# Patient Record
Sex: Male | Born: 1990 | Race: Black or African American | Hispanic: No | Marital: Single | Smoking: Current every day smoker
Health system: Southern US, Community
[De-identification: ages and names within clinical notes are randomized; demographics above are authoritative.]

---

## 2017-07-26 ENCOUNTER — Other Ambulatory Visit: Payer: Self-pay

## 2017-07-26 ENCOUNTER — Emergency Department: Payer: Self-pay

## 2017-07-26 ENCOUNTER — Emergency Department
Admission: EM | Admit: 2017-07-26 | Discharge: 2017-07-26 | Disposition: A | Discharge status: Home / Self Care | Payer: Self-pay | Attending: Emergency Medicine | Admitting: Emergency Medicine

## 2017-07-26 DIAGNOSIS — F17200 Nicotine dependence, unspecified, uncomplicated: Secondary | ICD-10-CM | POA: Insufficient documentation

## 2017-07-26 DIAGNOSIS — M4306 Spondylolysis, lumbar region: Secondary | ICD-10-CM

## 2017-07-26 DIAGNOSIS — M5440 Lumbago with sciatica, unspecified side: Secondary | ICD-10-CM

## 2017-07-26 MED ORDER — CYCLOBENZAPRINE HCL 10 MG PO TABS: 10.0000 mg | ORAL_TABLET | Freq: Three times a day (TID) | ORAL | 0 refills | Status: DC | PRN

## 2017-07-26 MED ORDER — NAPROXEN 500 MG PO TABS: 500.0000 mg | ORAL_TABLET | Freq: Two times a day (BID) | ORAL | Status: DC
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
## 2017-07-26 NOTE — Discharge Instructions (Signed)
Advised to consider chiropractor for further evaluation and treatment.

## 2017-08-15 ENCOUNTER — Other Ambulatory Visit: Payer: Self-pay

## 2017-08-15 ENCOUNTER — Emergency Department: Payer: Self-pay

## 2017-08-15 ENCOUNTER — Emergency Department
Admission: EM | Admit: 2017-08-15 | Discharge: 2017-08-15 | Disposition: A | Discharge status: Home / Self Care | Payer: Self-pay | Attending: Emergency Medicine | Admitting: Emergency Medicine

## 2017-08-15 DIAGNOSIS — Y9389 Activity, other specified: Secondary | ICD-10-CM | POA: Insufficient documentation

## 2017-08-15 DIAGNOSIS — S0081XA Abrasion of other part of head, initial encounter: Secondary | ICD-10-CM | POA: Insufficient documentation

## 2017-08-15 DIAGNOSIS — R51 Headache: Secondary | ICD-10-CM | POA: Insufficient documentation

## 2017-08-15 DIAGNOSIS — R0781 Pleurodynia: Secondary | ICD-10-CM | POA: Insufficient documentation

## 2017-08-15 DIAGNOSIS — H1132 Conjunctival hemorrhage, left eye: Secondary | ICD-10-CM | POA: Insufficient documentation

## 2017-08-15 DIAGNOSIS — M7918 Myalgia, other site: Secondary | ICD-10-CM | POA: Insufficient documentation

## 2017-08-15 DIAGNOSIS — Z79899 Other long term (current) drug therapy: Secondary | ICD-10-CM | POA: Insufficient documentation

## 2017-08-15 DIAGNOSIS — R6884 Jaw pain: Secondary | ICD-10-CM | POA: Insufficient documentation

## 2017-08-15 DIAGNOSIS — Y929 Unspecified place or not applicable: Secondary | ICD-10-CM | POA: Insufficient documentation

## 2017-08-15 DIAGNOSIS — Y999 Unspecified external cause status: Secondary | ICD-10-CM | POA: Insufficient documentation

## 2017-08-15 DIAGNOSIS — F17200 Nicotine dependence, unspecified, uncomplicated: Secondary | ICD-10-CM | POA: Insufficient documentation

## 2017-08-15 MED ORDER — KETOROLAC TROMETHAMINE 30 MG/ML IJ SOLN
30.0000 mg | Freq: Once | INTRAMUSCULAR | Status: AC
  Administered 2017-08-15: 30 mg via INTRAMUSCULAR
  Filled 2017-08-15: qty 1

## 2017-08-15 MED ORDER — TIZANIDINE HCL 4 MG PO TABS: 4.0000 mg | ORAL_TABLET | Freq: Three times a day (TID) | ORAL | 0 refills | Status: DC

## 2017-08-15 MED ORDER — KETOROLAC TROMETHAMINE 10 MG PO TABS: 10.0000 mg | ORAL_TABLET | Freq: Four times a day (QID) | ORAL | 0 refills | Status: DC | PRN
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
## 2017-09-25 ENCOUNTER — Emergency Department
Admission: EM | Admit: 2017-09-25 | Discharge: 2017-09-25 | Disposition: A | Discharge status: Home / Self Care | Payer: Self-pay | Attending: Emergency Medicine | Admitting: Emergency Medicine

## 2017-09-25 ENCOUNTER — Emergency Department: Payer: Self-pay

## 2017-09-25 ENCOUNTER — Encounter: Payer: Self-pay | Admitting: Emergency Medicine

## 2017-09-25 DIAGNOSIS — S9031XA Contusion of right foot, initial encounter: Secondary | ICD-10-CM | POA: Insufficient documentation

## 2017-09-25 DIAGNOSIS — Y929 Unspecified place or not applicable: Secondary | ICD-10-CM | POA: Insufficient documentation

## 2017-09-25 DIAGNOSIS — F17200 Nicotine dependence, unspecified, uncomplicated: Secondary | ICD-10-CM | POA: Insufficient documentation

## 2017-09-25 DIAGNOSIS — Y999 Unspecified external cause status: Secondary | ICD-10-CM | POA: Insufficient documentation

## 2017-09-25 DIAGNOSIS — Z79899 Other long term (current) drug therapy: Secondary | ICD-10-CM | POA: Insufficient documentation

## 2017-09-25 DIAGNOSIS — B880 Other acariasis: Secondary | ICD-10-CM | POA: Insufficient documentation

## 2017-09-25 DIAGNOSIS — Y939 Activity, unspecified: Secondary | ICD-10-CM | POA: Insufficient documentation

## 2017-09-25 DIAGNOSIS — S62514A Nondisplaced fracture of proximal phalanx of right thumb, initial encounter for closed fracture: Secondary | ICD-10-CM | POA: Insufficient documentation

## 2017-09-25 DIAGNOSIS — W228XXA Striking against or struck by other objects, initial encounter: Secondary | ICD-10-CM | POA: Insufficient documentation

## 2017-09-25 MED ORDER — TRIAMCINOLONE ACETONIDE 0.1 % EX CREA: 1.0000 "application " | TOPICAL_CREAM | Freq: Four times a day (QID) | CUTANEOUS | 0 refills | Status: DC

## 2017-09-25 MED ORDER — HYDROCODONE-ACETAMINOPHEN 5-325 MG PO TABS: 1.0000 | ORAL_TABLET | ORAL | 0 refills | Status: DC | PRN

## 2017-09-25 MED ORDER — MELOXICAM 15 MG PO TABS: 15.0000 mg | ORAL_TABLET | Freq: Every day | ORAL | 0 refills | Status: DC
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
## 2017-09-25 NOTE — ED Triage Notes (Signed)
Patient presents to the ED with painful right foot and right thumb since patient dropped a 145lb steel plate on both at separate times yesterday.  Patient also reports rash for several days after dealing with hay.  Patient states I sort of got hurt at work but I don't want to say it's work related or anything.

## 2017-10-18 ENCOUNTER — Emergency Department
Admission: EM | Admit: 2017-10-18 | Discharge: 2017-10-18 | Disposition: A | Discharge status: Home / Self Care | Payer: Self-pay | Attending: Emergency Medicine | Admitting: Emergency Medicine

## 2017-10-18 ENCOUNTER — Encounter: Payer: Self-pay | Admitting: Emergency Medicine

## 2017-10-18 DIAGNOSIS — F17200 Nicotine dependence, unspecified, uncomplicated: Secondary | ICD-10-CM | POA: Insufficient documentation

## 2017-10-18 DIAGNOSIS — X58XXXA Exposure to other specified factors, initial encounter: Secondary | ICD-10-CM | POA: Insufficient documentation

## 2017-10-18 DIAGNOSIS — Y999 Unspecified external cause status: Secondary | ICD-10-CM | POA: Insufficient documentation

## 2017-10-18 DIAGNOSIS — S62501A Fracture of unspecified phalanx of right thumb, initial encounter for closed fracture: Secondary | ICD-10-CM | POA: Insufficient documentation

## 2017-10-18 DIAGNOSIS — Y939 Activity, unspecified: Secondary | ICD-10-CM | POA: Insufficient documentation

## 2017-10-18 DIAGNOSIS — Y929 Unspecified place or not applicable: Secondary | ICD-10-CM | POA: Insufficient documentation

## 2017-10-18 DIAGNOSIS — Z79899 Other long term (current) drug therapy: Secondary | ICD-10-CM | POA: Insufficient documentation

## 2017-10-18 NOTE — ED Notes (Addendum)
See triage note  States he was seen and had splint applied to right thumb  States he wanted the splint to be removed  Has not f/u with ortho MD good pulses

## 2017-10-18 NOTE — Discharge Instructions (Addendum)
Your splint has been replaced but you need to follow orthopedic for definitive evaluation and treatment consisting of casting.

## 2017-10-18 NOTE — ED Provider Notes (Signed)
[facility] Emergency Department Provider Note   ____________________________________________   First MD Initiated Contact with Patient 10/18/17 1036     (approximate)  I have reviewed the triage vital signs and the nursing notes.   HISTORY  Chief Complaint Hand Injury    HPI Miguel Allen is a 27 y.o. male patient presents status post right thumb fracture 3 weeks ago.  Patient was seen at this facility and placed in a thumb spica splint.  Patient advised to follow orthopedic but did not comply.  Patient requesting reevaluation and removal of splint and to have some placed in a cast by this department.  Patient has not removed previous splint.   History reviewed. No pertinent past medical history.  There are no active problems to display for this patient.   History reviewed. No pertinent surgical history.  Prior to Admission medications   Medication Sig Start Date End Date Taking? Authorizing Provider  cyclobenzaprine (FLEXERIL) 10 MG tablet Take 1 tablet (10 mg total) by mouth 3 (three) times daily as needed. 07/26/17   Joni ReiningSmith, Ronald K, PA-C  HYDROcodone-acetaminophen (NORCO/VICODIN) 5-325 MG tablet Take 1 tablet by mouth every 4 (four) hours as needed for moderate pain. 09/25/17   Cuthriell, Delorise RoyalsJonathan D, PA-C  ketorolac (TORADOL) 10 MG tablet Take 1 tablet (10 mg total) by mouth every 6 (six) hours as needed. 08/15/17   Enid DerryWagner, Ashley, PA-C  meloxicam (MOBIC) 15 MG tablet Take 1 tablet (15 mg total) by mouth daily. 09/25/17   Cuthriell, Delorise RoyalsJonathan D, PA-C  naproxen (NAPROSYN) 500 MG tablet Take 1 tablet (500 mg total) by mouth 2 (two) times daily with a meal. 07/26/17   Joni ReiningSmith, Ronald K, PA-C  tiZANidine (ZANAFLEX) 4 MG tablet Take 1 tablet (4 mg total) by mouth 3 (three) times daily. 08/15/17 08/15/18  Enid DerryWagner, Ashley, PA-C  triamcinolone cream (KENALOG) 0.1 % Apply 1 application topically 4 (four) times daily. 09/25/17   Cuthriell, Delorise RoyalsJonathan D, PA-C     Allergies Patient has no known allergies.  No family history on file.  Social History Social History   Tobacco Use  . Smoking status: Current Every Day Smoker  . Smokeless tobacco: Never Used  Substance Use Topics  . Alcohol use: Yes  . Drug use: Yes    Review of Systems Constitutional: No fever/chills Eyes: No visual changes. ENT: No sore throat. Cardiovascular: Denies chest pain. Respiratory: Denies shortness of breath. Gastrointestinal: No abdominal pain.  No nausea, no vomiting.  No diarrhea.  No constipation. Genitourinary: Negative for dysuria. Musculoskeletal: Fractured right thumb. Skin: Negative for rash. Neurological: Negative for headaches, focal weakness or numbness.   ____________________________________________   PHYSICAL EXAM:  VITAL SIGNS: ED Triage Vitals  Enc Vitals Group     BP 10/18/17 1020 129/67     Pulse Rate 10/18/17 1020 70     Resp 10/18/17 1020 20     Temp 10/18/17 1020 98.2 F (36.8 C)     Temp Source 10/18/17 1020 Oral     SpO2 10/18/17 1020 98 %     Weight 10/18/17 1021 200 lb (90.7 kg)     Height 10/18/17 1021 6\' 2"  (1.88 m)     Head Circumference --      Peak Flow --      Pain Score 10/18/17 1021 0     Pain Loc --      Pain Edu? --      Excl. in GC? --    Constitutional: Alert and oriented.  Well appearing and in no acute distress. Cardiovascular: Normal rate, regular rhythm. Grossly normal heart sounds.  Good peripheral circulation. Respiratory: Normal respiratory effort.  No retractions. Lungs CTAB. Skin:  Skin is warm, dry and intact. No rash noted. Psychiatric: Mood and affect are normal. Speech and behavior are normal.  ____________________________________________   LABS (all labs ordered are listed, but only abnormal results are displayed)  Labs Reviewed - No data to display ____________________________________________  EKG   ____________________________________________  RADIOLOGY Reviewed x-ray from  previous visit.  ED MD interpretation:    Official radiology report(s): No results found.  ____________________________________________   PROCEDURES  Procedure(s) performed: None  Procedures  Critical Care performed: No  ____________________________________________   INITIAL IMPRESSION / ASSESSMENT AND PLAN / ED COURSE  As part of my medical decision making, I reviewed the following data within the electronic MEDICAL RECORD NUMBER    Patient presents to ED requesting old splint be removed and a cast placed to complete healing process of fractured right thumb.  Previous splint is in decay and dirty.  Discussed patient rationale for not cast in the ED.  Patient will be resplinted and advised to follow-up with orthopedics for definitive evaluation and treatment.      ____________________________________________   FINAL CLINICAL IMPRESSION(S) / ED DIAGNOSES  Final diagnoses:  Closed nondisplaced fracture of phalanx of right thumb, unspecified phalanx, initial encounter     ED Discharge Orders    None       Note:  This document was prepared using Dragon voice recognition software and may include unintentional dictation errors.    Joni Reining, PA-C 10/18/17 1055    Jene Every, MD 10/18/17 [phone]

## 2017-10-18 NOTE — ED Triage Notes (Signed)
Pt reports three weeks ago he was seen here and put in a cast for a broken thumb. Pt states that he needs his hand checked out to see if he can come out of the cast. Pt admits that he did not follow up with an orthopedic doctor.

## 2017-12-25 ENCOUNTER — Encounter: Payer: Self-pay | Admitting: Emergency Medicine

## 2017-12-25 ENCOUNTER — Other Ambulatory Visit: Payer: Self-pay

## 2017-12-25 ENCOUNTER — Emergency Department: Payer: No Typology Code available for payment source

## 2017-12-25 ENCOUNTER — Emergency Department
Admission: EM | Admit: 2017-12-25 | Discharge: 2017-12-26 | Disposition: A | Discharge status: Home / Self Care | Payer: No Typology Code available for payment source | Attending: Emergency Medicine | Admitting: Emergency Medicine

## 2017-12-25 DIAGNOSIS — S199XXA Unspecified injury of neck, initial encounter: Secondary | ICD-10-CM | POA: Diagnosis present

## 2017-12-25 DIAGNOSIS — Y999 Unspecified external cause status: Secondary | ICD-10-CM | POA: Diagnosis not present

## 2017-12-25 DIAGNOSIS — F17200 Nicotine dependence, unspecified, uncomplicated: Secondary | ICD-10-CM | POA: Insufficient documentation

## 2017-12-25 DIAGNOSIS — S1093XA Contusion of unspecified part of neck, initial encounter: Secondary | ICD-10-CM | POA: Diagnosis not present

## 2017-12-25 DIAGNOSIS — Y92481 Parking lot as the place of occurrence of the external cause: Secondary | ICD-10-CM | POA: Insufficient documentation

## 2017-12-25 DIAGNOSIS — R51 Headache: Secondary | ICD-10-CM | POA: Insufficient documentation

## 2017-12-25 DIAGNOSIS — Y9389 Activity, other specified: Secondary | ICD-10-CM | POA: Insufficient documentation

## 2017-12-25 LAB — CBC
HCT: 36.8 % — ABNORMAL LOW (ref 40.0–52.0)
Hemoglobin: 12.9 g/dL — ABNORMAL LOW (ref 13.0–18.0)
MCH: 29.3 pg (ref 26.0–34.0)
MCHC: 35 g/dL (ref 32.0–36.0)
MCV: 83.7 fL (ref 80.0–100.0)
PLATELETS: 195 10*3/uL (ref 150–440)
RBC: 4.39 MIL/uL — ABNORMAL LOW (ref 4.40–5.90)
RDW: 14.4 % (ref 11.5–14.5)
WBC: 10.7 10*3/uL — ABNORMAL HIGH (ref 3.8–10.6)

## 2017-12-25 MED ORDER — ONDANSETRON HCL 4 MG/2ML IJ SOLN
4.0000 mg | Freq: Once | INTRAMUSCULAR | Status: AC
Start: 2017-12-25 — End: 2017-12-25
  Administered 2017-12-25: 4 mg via INTRAVENOUS
  Filled 2017-12-25: qty 2

## 2017-12-25 MED ORDER — MORPHINE SULFATE (PF) 2 MG/ML IV SOLN
2.0000 mg | Freq: Once | INTRAVENOUS | Status: AC
  Administered 2017-12-25: 2 mg via INTRAVENOUS
  Filled 2017-12-25: qty 1
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
## 2017-12-25 NOTE — ED Notes (Signed)
Pt called x 1 without response

## 2017-12-26 ENCOUNTER — Encounter: Payer: Self-pay | Admitting: Radiology

## 2017-12-26 ENCOUNTER — Emergency Department: Payer: No Typology Code available for payment source

## 2017-12-26 LAB — COMPREHENSIVE METABOLIC PANEL
ALK PHOS: 49 U/L (ref 38–126)
ALT: 12 U/L (ref 0–44)
AST: 22 U/L (ref 15–41)
Albumin: 4.4 g/dL (ref 3.5–5.0)
Anion gap: 6 (ref 5–15)
BUN: 12 mg/dL (ref 6–20)
CALCIUM: 8.8 mg/dL — AB (ref 8.9–10.3)
CHLORIDE: 108 mmol/L (ref 98–111)
CO2: 26 mmol/L (ref 22–32)
CREATININE: 0.87 mg/dL (ref 0.61–1.24)
GFR calc Af Amer: >60 mL/min (ref >60)
GFR calc non Af Amer: >60 mL/min (ref >60)
GLUCOSE: 101 mg/dL — AB (ref 70–99)
Potassium: 3.8 mmol/L (ref 3.5–5.1)
Sodium: 140 mmol/L (ref 135–145)
Total Bilirubin: 1 mg/dL (ref 0.3–1.2)
Total Protein: 7.3 g/dL (ref 6.5–8.1)

## 2017-12-26 MED ORDER — METHYLPREDNISOLONE SODIUM SUCC 125 MG IJ SOLR
125.0000 mg | Freq: Once | INTRAMUSCULAR | Status: AC
  Administered 2017-12-26: 125 mg via INTRAVENOUS

## 2017-12-26 MED ORDER — METHYLPREDNISOLONE SODIUM SUCC 125 MG IJ SOLR
INTRAMUSCULAR | Status: DC
Start: 2017-12-26 — End: 2017-12-26
  Filled 2017-12-26: qty 2

## 2017-12-26 MED ORDER — OXYCODONE-ACETAMINOPHEN 5-325 MG PO TABS: 1.0000 | ORAL_TABLET | ORAL | 0 refills | Status: AC | PRN

## 2017-12-26 MED ORDER — IOHEXOL 350 MG/ML SOLN
75.0000 mL | Freq: Once | INTRAVENOUS | Status: AC | PRN
  Administered 2017-12-26: 75 mL via INTRAVENOUS

## 2017-12-26 NOTE — ED Notes (Signed)
Patient transported to CT at this time. 

## 2017-12-26 NOTE — ED Notes (Signed)
Pt given soft drink at this time per verbal okay by Dr Manson Passey.
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
